# Patient Record
Sex: Female | Born: 1975 | Race: White | Hispanic: No | Marital: Married | Smoking: Former smoker
Health system: Southern US, Community
[De-identification: ages and names within clinical notes are randomized; demographics above are authoritative.]

## PROBLEM LIST (undated history)

## (undated) DIAGNOSIS — R112 Nausea with vomiting, unspecified: Secondary | ICD-10-CM

## (undated) DIAGNOSIS — T4145XA Adverse effect of unspecified anesthetic, initial encounter: Secondary | ICD-10-CM

## (undated) DIAGNOSIS — Z9889 Other specified postprocedural states: Secondary | ICD-10-CM

## (undated) DIAGNOSIS — R1110 Vomiting, unspecified: Secondary | ICD-10-CM

## (undated) DIAGNOSIS — F32A Depression, unspecified: Secondary | ICD-10-CM

## (undated) DIAGNOSIS — T8859XA Other complications of anesthesia, initial encounter: Secondary | ICD-10-CM

## (undated) DIAGNOSIS — F329 Major depressive disorder, single episode, unspecified: Secondary | ICD-10-CM

## (undated) DIAGNOSIS — C801 Malignant (primary) neoplasm, unspecified: Secondary | ICD-10-CM

## (undated) HISTORY — DX: Vomiting, unspecified: R11.10

## (undated) HISTORY — PX: WISDOM TOOTH EXTRACTION: SHX21

## (undated) HISTORY — PX: OTHER SURGICAL HISTORY: SHX169

## (undated) HISTORY — PX: NASAL FRACTURE SURGERY: SHX718

---

## 2002-06-30 ENCOUNTER — Other Ambulatory Visit: Admission: RE | Admit: 2002-06-30 | Discharge: 2002-06-30 | Payer: Self-pay | Admitting: Obstetrics and Gynecology

## 2002-07-30 ENCOUNTER — Ambulatory Visit (HOSPITAL_COMMUNITY): Admission: RE | Admit: 2002-07-30 | Discharge: 2002-07-30 | Payer: Self-pay | Admitting: Obstetrics and Gynecology

## 2003-02-25 ENCOUNTER — Other Ambulatory Visit: Admission: RE | Admit: 2003-02-25 | Discharge: 2003-02-25 | Payer: Self-pay | Admitting: Obstetrics and Gynecology

## 2003-07-07 ENCOUNTER — Ambulatory Visit (HOSPITAL_COMMUNITY): Admission: AD | Admit: 2003-07-07 | Discharge: 2003-07-07 | Payer: Self-pay | Admitting: Obstetrics and Gynecology

## 2003-09-26 ENCOUNTER — Inpatient Hospital Stay (HOSPITAL_COMMUNITY): Admission: AD | Admit: 2003-09-26 | Discharge: 2003-09-29 | Payer: Self-pay | Admitting: Obstetrics and Gynecology

## 2003-09-30 ENCOUNTER — Encounter: Admission: RE | Admit: 2003-09-30 | Discharge: 2003-10-30 | Payer: Self-pay | Admitting: Obstetrics and Gynecology

## 2004-03-28 ENCOUNTER — Other Ambulatory Visit: Admission: RE | Admit: 2004-03-28 | Discharge: 2004-03-28 | Payer: Self-pay | Admitting: Obstetrics and Gynecology

## 2006-04-18 ENCOUNTER — Inpatient Hospital Stay (HOSPITAL_COMMUNITY): Admission: AD | Admit: 2006-04-18 | Discharge: 2006-04-18 | Payer: Self-pay | Admitting: Obstetrics & Gynecology

## 2006-04-22 ENCOUNTER — Inpatient Hospital Stay (HOSPITAL_COMMUNITY): Admission: AD | Admit: 2006-04-22 | Discharge: 2006-04-22 | Payer: Self-pay | Admitting: Obstetrics and Gynecology

## 2006-04-29 ENCOUNTER — Inpatient Hospital Stay (HOSPITAL_COMMUNITY): Admission: AD | Admit: 2006-04-29 | Discharge: 2006-05-02 | Payer: Self-pay | Admitting: Obstetrics & Gynecology

## 2006-05-27 ENCOUNTER — Inpatient Hospital Stay (HOSPITAL_COMMUNITY): Admission: AD | Admit: 2006-05-27 | Discharge: 2006-06-01 | Payer: Self-pay | Admitting: Obstetrics & Gynecology

## 2006-09-19 ENCOUNTER — Ambulatory Visit (HOSPITAL_COMMUNITY): Admission: RE | Admit: 2006-09-19 | Discharge: 2006-09-19 | Payer: Self-pay | Admitting: Obstetrics and Gynecology

## 2006-12-05 ENCOUNTER — Inpatient Hospital Stay (HOSPITAL_COMMUNITY): Admission: RE | Admit: 2006-12-05 | Discharge: 2006-12-08 | Payer: Self-pay | Admitting: Obstetrics and Gynecology

## 2010-10-12 ENCOUNTER — Other Ambulatory Visit: Payer: Self-pay | Admitting: Family Medicine

## 2010-10-12 ENCOUNTER — Inpatient Hospital Stay (INDEPENDENT_AMBULATORY_CARE_PROVIDER_SITE_OTHER)
Admission: RE | Admit: 2010-10-12 | Discharge: 2010-10-12 | Disposition: A | Discharge status: Home / Self Care | Payer: 59 | Source: Ambulatory Visit | Attending: Family Medicine | Admitting: Family Medicine

## 2010-10-12 ENCOUNTER — Ambulatory Visit
Admission: RE | Admit: 2010-10-12 | Discharge: 2010-10-12 | Disposition: A | Discharge status: Home / Self Care | Payer: 59 | Source: Ambulatory Visit | Attending: Family Medicine | Admitting: Family Medicine

## 2010-10-12 ENCOUNTER — Encounter: Payer: Self-pay | Admitting: Family Medicine

## 2010-10-12 ENCOUNTER — Other Ambulatory Visit: Payer: Self-pay | Admitting: Occupational Medicine

## 2010-10-12 DIAGNOSIS — M62838 Other muscle spasm: Secondary | ICD-10-CM

## 2010-10-19 ENCOUNTER — Ambulatory Visit (INDEPENDENT_AMBULATORY_CARE_PROVIDER_SITE_OTHER): Payer: 59 | Admitting: Family Medicine

## 2010-10-19 ENCOUNTER — Encounter: Payer: Self-pay | Admitting: Family Medicine

## 2010-10-19 VITALS — BP 104/68 | HR 50 | Ht 61.0 in | Wt 122.0 lb

## 2010-10-19 DIAGNOSIS — Z23 Encounter for immunization: Secondary | ICD-10-CM

## 2010-10-19 DIAGNOSIS — S139XXA Sprain of joints and ligaments of unspecified parts of neck, initial encounter: Secondary | ICD-10-CM

## 2010-10-19 DIAGNOSIS — S161XXA Strain of muscle, fascia and tendon at neck level, initial encounter: Secondary | ICD-10-CM

## 2010-10-19 NOTE — Progress Notes (Signed)
  Subjective:    Patient ID: Vickie Green, female    DOB: 1975-09-19, 35 y.o.   MRN: [id number]  HPI  No trauma.  Has right sided neck pain and dx with a strain.  Was going on vacation and think may have overdone it.  Pain even at rest.  No spasm in her muscles.  Went to urgent care and was seen.  SHe was put on an NSAID an da pain pill.  Hasn't used the pain med in about 3 days.  Says the diclofenac  Makes her feel weird in the morning. She has been on it for a week.   She is considering havingher IUD removed. She has had it for 3 years but feels more emotional on it. Doesn't want any more kids though.  Review of Systems     Objective:   Physical Exam  Constitutional: She appears well-developed and well-nourished.  HENT:  Head: Normocephalic and atraumatic.  Eyes: Conjunctivae are normal. Pupils are equal, round, and reactive to light.  Neck: Normal range of motion. Neck supple. No thyromegaly present.  Cardiovascular: Normal rate, regular rhythm and normal heart sounds.   Pulmonary/Chest: Effort normal and breath sounds normal.  Musculoskeletal:       Neck with NROM. Sligh dec rotation to the right. nontender cervical spine and paraspinous muscles.            Assessment & Plan:  Next strain-I. think she is improving over the last 2 weeks. We discussed continuing her insulin but the Mobic makes her feel strange and we can change to Aleve or ibuprofen. Certainly continue to take it with food and water. This occurred she would like to see a chiropractor or consider physical therapy. I did give her some exercises to take home and to do and if she is not better in the next 3 weeks then she is to call and we can make a referral for physical therapy and consider x-ray. I do think she is progressing as expected for a musculoskeletal strain.  IUD we discussed the pros and cons of having her IUD removed. It is certainly something she should consider discussing with her OB/GYN as well. I will  leave that in her hands make that decision.

## 2010-10-19 NOTE — Patient Instructions (Addendum)
Can start the exercises and if not getting better in the next 3 weeks then let me know. It

## 2010-11-14 ENCOUNTER — Other Ambulatory Visit: Payer: Self-pay | Admitting: Obstetrics and Gynecology

## 2010-11-14 DIAGNOSIS — N644 Mastodynia: Secondary | ICD-10-CM

## 2010-11-20 ENCOUNTER — Ambulatory Visit
Admission: RE | Admit: 2010-11-20 | Discharge: 2010-11-20 | Disposition: A | Discharge status: Home / Self Care | Payer: 59 | Source: Ambulatory Visit | Attending: Obstetrics and Gynecology | Admitting: Obstetrics and Gynecology

## 2010-11-20 DIAGNOSIS — N644 Mastodynia: Secondary | ICD-10-CM

## 2011-04-04 LAB — CBC
HCT: 31.6 — ABNORMAL LOW
Hemoglobin: 8.9 — ABNORMAL LOW
MCV: 83.9
Platelets: 134 — ABNORMAL LOW
Platelets: 151
RDW: 13.7
WBC: 5.9

## 2011-04-04 LAB — RH IMMUNE GLOB WKUP(>/=20WKS)(NOT WOMEN'S HOSP): Fetal Screen: NEGATIVE

## 2011-05-21 NOTE — Progress Notes (Signed)
Summary: NECK PAIN Room 5   Vital Signs:  Patient Profile:   35 Years Old Female CC:      Neck pain x 1 week Height:     60 inches Weight:      122 pounds O2 Sat:      99 % O2 treatment:    Room Air Temp:     99.3 degrees F oral Pulse rate:   71 / minute Pulse rhythm:   regular Resp:     12 per minute BP sitting:   109 / 71  (left arm) Cuff size:   regular  Vitals Entered By: Emilio Math (October 12, 2010 10:34 AM)                  Prior Medication List:  No prior medications documented  Current Allergies (reviewed today): ! * LACTOSEHistory of Present Illness Chief Complaint: Neck pain x 1 week History of Present Illness: Patient reports having neck pain for about 2 weeks. started on R side but spreaded tothe L side as well. Now she has trouble bending her neck or moving her head. She denies any trauma or injury to her neck. She occasional pops her neck when she has minor discomfort but she never had anything like this before.  Current Problems: MUSCLE SPASM, TRAPEZIUS (ICD-728.85)   Current Meds MIRENA 20 MCG/24HR IUD (LEVONORGESTREL)  MOBIC 7.5 MG TABS (MELOXICAM) 1 by mouth q day to twice a day take w/food do not take w/motrin or alleve ORPHENADRINE CITRATE CR 100 MG XR12H-TAB (ORPHENADRINE CITRATE) 1 by mouth twice a dya just take at night if sedation occurs  REVIEW OF SYSTEMS Constitutional Symptoms      Denies fever, chills, night sweats, weight loss, weight gain, and fatigue.  Eyes       Denies change in vision, eye pain, eye discharge, glasses, contact lenses, and eye surgery. Ear/Nose/Throat/Mouth       Denies hearing loss/aids, change in hearing, ear pain, ear discharge, dizziness, frequent runny nose, frequent nose bleeds, sinus problems, sore throat, hoarseness, and tooth pain or bleeding.  Respiratory       Denies dry cough, productive cough, wheezing, shortness of breath, asthma, bronchitis, and emphysema/COPD.  Cardiovascular       Denies murmurs,  chest pain, and tires easily with exhertion.    Gastrointestinal       Denies stomach pain, nausea/vomiting, diarrhea, constipation, blood in bowel movements, and indigestion. Genitourniary       Denies painful urination, kidney stones, and loss of urinary control. Neurological       Denies paralysis, seizures, and fainting/blackouts. Musculoskeletal       Complains of muscle pain, joint pain, joint stiffness, and decreased range of motion.      Denies redness, swelling, muscle weakness, and gout.  Skin       Denies bruising, unusual mles/lumps or sores, and hair/skin or nail changes.  Psych       Denies mood changes, temper/anger issues, anxiety/stress, speech problems, depression, and sleep problems.  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 Father, Diabetes Mother, Healthy  Social History: Reviewed history and no changes required. 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 Physical Exam General appearance: well developed, well nourished,  mild distress Head: normocephalic,  atraumatic Neck: tendernesss along the bifurcation of both trapezius muscles range of motion  Back: no tenderness over musculature, straight leg raises negative bilaterally, deep tendon reflexes 2+ at achilles and patella Skin: no obvious rashes or lesions MSE: oriented to time, place, and person Assessment New Problems: MUSCLE SPASM, TRAPEZIUS (ICD-728.85)  muscle spasm  cervical strain  Plan New Medications/Changes: ORPHENADRINE CITRATE CR 100 MG XR12H-TAB (ORPHENADRINE CITRATE) 1 by mouth twice a dya just take at night if sedation occurs  #60 x 0, 10/12/2010, Hassan Rowan MD MOBIC 7.5 MG TABS (MELOXICAM) 1  by mouth q day to twice a day take w/food do not take w/motrin or alleve  #30 x 1, 10/12/2010, Hassan Rowan MD  New Orders: Admin of Therapeutic Inj  intramuscular or subcutaneous [16109] New Patient Level III [99203] T-DG Cervical Spine Complete 5 views [72050] Planning Comments:   may need to get chiropractic evaluation  Follow Up: Follow up on an as needed basis, Follow up with Primary Physician  The patient and/or caregiver has been counseled thoroughly with regard to medications prescribed including dosage, schedule, interactions, rationale for use, and possible side effects and they verbalize understanding.  Diagnoses and expected course of recovery discussed and will return if not improved as expected or if the condition worsens. Patient and/or caregiver verbalized understanding.  Prescriptions: ORPHENADRINE CITRATE CR 100 MG XR12H-TAB (ORPHENADRINE CITRATE) 1 by mouth twice a dya just take at night if sedation occurs  #60 x 0   Entered and Authorized by:   Hassan Rowan MD   Signed by:   Hassan Rowan MD on 10/12/2010   Method used:   Print then Give to Patient   RxID:   [id number] MOBIC 7.5 MG TABS (MELOXICAM) 1 by mouth q day to twice a day take w/food do not take w/motrin or alleve  #30 x 1   Entered and Authorized by:   Hassan Rowan MD   Signed by:   Hassan Rowan MD on 10/12/2010   Method used:   Print then Give to Patient   RxID:   [id number]   Patient Instructions: 1)  Please schedule a follow-up appointment in 2 weeks w/PCP or Chiropracter of choice  [facility] [phone] is an option 2)  use mobic and norflex as needed 3)  call us 2 hours after x-ray for results  Medication Administration  Injection # 1:    Medication: Ketorolac-Toradol 15mg     Diagnosis: MUSCLE SPASM, TRAPEZIUS (ICD-728.85)    Route: IM    Site: RUOQ gluteus    Exp Date: 04/19/2011    Lot #: [id number]    Mfr: hospira    Comments: 60mg  given    Patient tolerated injection  without complications    Given by: Lajean Saver RN (October 12, 2010 11:42 AM)  Orders Added: 1)  Admin of Therapeutic Inj  intramuscular or subcutaneous [96372] 2)  New Patient Level III [40102] 3)  T-DG Cervical Spine Complete 5 views [72050]

## 2013-03-17 ENCOUNTER — Other Ambulatory Visit (HOSPITAL_COMMUNITY)
Admission: RE | Admit: 2013-03-17 | Discharge: 2013-03-17 | Disposition: A | Discharge status: Home / Self Care | Payer: PRIVATE HEALTH INSURANCE | Source: Ambulatory Visit | Attending: Family Medicine | Admitting: Family Medicine

## 2013-03-17 ENCOUNTER — Encounter: Payer: Self-pay | Admitting: Family Medicine

## 2013-03-17 ENCOUNTER — Other Ambulatory Visit: Payer: Self-pay | Admitting: Family Medicine

## 2013-03-17 ENCOUNTER — Ambulatory Visit (INDEPENDENT_AMBULATORY_CARE_PROVIDER_SITE_OTHER): Payer: PRIVATE HEALTH INSURANCE | Admitting: Family Medicine

## 2013-03-17 DIAGNOSIS — N760 Acute vaginitis: Secondary | ICD-10-CM | POA: Insufficient documentation

## 2013-03-17 DIAGNOSIS — Z01419 Encounter for gynecological examination (general) (routine) without abnormal findings: Secondary | ICD-10-CM | POA: Insufficient documentation

## 2013-03-17 DIAGNOSIS — Z1151 Encounter for screening for human papillomavirus (HPV): Secondary | ICD-10-CM | POA: Insufficient documentation

## 2013-03-17 DIAGNOSIS — Z23 Encounter for immunization: Secondary | ICD-10-CM

## 2013-03-17 DIAGNOSIS — Z113 Encounter for screening for infections with a predominantly sexual mode of transmission: Secondary | ICD-10-CM | POA: Insufficient documentation

## 2013-03-17 DIAGNOSIS — R109 Unspecified abdominal pain: Secondary | ICD-10-CM

## 2013-03-17 DIAGNOSIS — K644 Residual hemorrhoidal skin tags: Secondary | ICD-10-CM

## 2013-03-17 LAB — CBC WITH DIFFERENTIAL/PLATELET
Basophils Absolute: 0 10*3/uL (ref 0.0–0.1)
Basophils Relative: 0 % (ref 0–1)
Eosinophils Absolute: 0.1 10*3/uL (ref 0.0–0.7)
MCHC: 34.7 g/dL (ref 30.0–36.0)
Neutro Abs: 1.9 10*3/uL (ref 1.7–7.7)
Neutrophils Relative %: 55 % (ref 43–77)
Platelets: 236 10*3/uL (ref 150–400)
RDW: 13.7 % (ref 11.5–15.5)

## 2013-03-17 LAB — COMPREHENSIVE METABOLIC PANEL
AST: 20 U/L (ref 0–37)
Albumin: 4.6 g/dL (ref 3.5–5.2)
Alkaline Phosphatase: 52 U/L (ref 39–117)
Potassium: 4.1 mEq/L (ref 3.5–5.3)
Sodium: 138 mEq/L (ref 135–145)
Total Bilirubin: 0.5 mg/dL (ref 0.3–1.2)
Total Protein: 7.4 g/dL (ref 6.0–8.3)

## 2013-03-17 MED ORDER — LIDOCAINE-HYDROCORTISONE ACE 3-0.5 % RE CREA: 1.0000 | TOPICAL_CREAM | Freq: Two times a day (BID) | RECTAL | Status: DC

## 2013-03-17 NOTE — Progress Notes (Signed)
Subjective:    Patient ID: Vickie Green, female    DOB: 07-12-1975, 36 y.o.   MRN: [id number]  HPI  Last seen 2 yr ago for neck pain.   Has has a mass on rectum since Feb. Hs been getting larger and feels like hard to clean the area. Has been very painful and has noticed some bleeding. Hx of hemorrhoids about 6 years ago during her last pregnancy.   Has a occ abdominal pain in the lower abdomen. Thought it may be related to lactose intolerance. Says feels like a hot knife poking into her abdomen. Starts below the belly button. Usually lasts about an hour. Last time lasted a day. +nausea but no vomiting. Usually happens after a BM. Can happen after urination. Has used Gas-x and Tylenol and helped some. No fever. Did have some chills with the last episode.   Review of Systems  BP 123/76  Pulse 87  Wt 117 lb (53.071 kg)  BMI 22.12 kg/m2    Allergies  Allergen Reactions  . Lactose     Past Medical History  Diagnosis Date  . Hyperemesis     during pregnanacy  . Endometriosis     Past Surgical History  Procedure Laterality Date  . Nasal fracture surgery    . Laproscopy    . Cesarean section      4098,1191    History   Social History  . Marital Status: Married    Spouse Name: N/A    Number of Children: N/A  . Years of Education: N/A   Occupational History  . Not on file.   Social History Main Topics  . Smoking status: Former Smoker    Quit date: 11/16/2001  . Smokeless tobacco: Not on file  . Alcohol Use: Yes  . Drug Use: No  . Sexual Activity: Yes    Birth Control/ Protection: IUD   Other Topics Concern  . Not on file   Social History Narrative  . No narrative on file    Family History  Problem Relation Age of Onset  . Alcohol abuse    . Colon cancer    . Brain cancer    . Pancreatic cancer    . Depression    . Stroke      grandmother  . Emphysema    . Diverticulitis      Outpatient Encounter Prescriptions as of 03/17/2013  Medication Sig  Dispense Refill  . lidocaine-hydrocortisone (ANAMANTEL HC) 3-0.5 % CREA Place 1 Applicatorful rectally 2 (two) times daily.  28.35 g  2  . [DISCONTINUED] Levonorgestrel (MIRENA IU) by Intrauterine route.        . [DISCONTINUED] meloxicam (MOBIC) 7.5 MG tablet Take 7.5 mg by mouth daily.        . [DISCONTINUED] orphenadrine (NORFLEX) 100 MG tablet Take 100 mg by mouth 2 (two) times daily as needed.         No facility-administered encounter medications on file as of 03/17/2013.          Objective:   Physical Exam  Constitutional: She appears well-developed and well-nourished.  HENT:  Head: Normocephalic and atraumatic.  Abdominal: Soft. Bowel sounds are normal. She exhibits no distension and no mass. There is tenderness. There is no rebound and no guarding. Hernia confirmed negative in the right inguinal area and confirmed negative in the left inguinal area.  Diffusely tender.  Genitourinary: Vagina normal and uterus normal. Rectal exam shows external hemorrhoid. There is no rash on the right  labia. There is no rash on the left labia. Cervix exhibits no motion tenderness, no discharge and no friability. Right adnexum displays no mass, no tenderness and no fullness. Left adnexum displays no mass, no tenderness and no fullness.  Lymphadenopathy:       Right: No inguinal adenopathy present.       Left: No inguinal adenopathy present.  Skin: Skin is warm and dry.  Psychiatric: She has a normal mood and affect. Her behavior is normal.          Assessment & Plan:  Lower abd pain - Unclear etiology. Not consistant with diverticulis as is brief and then can resolve for weeks.  We'll also test for milk allergy as well as gluten intolerance. We'll also do a CBC with differential and check a CMP to make sure liver enzymes are normal. We did go ahead and perform her here today since we were doing a pelvic exam. We did add a wet prep and GC and chlamydia to that. If all labs normal then recommend  referal to GI.   Hemorrhoids-  Exam confirmed external hemorrhoid. It is not thrombosed. It is mildly swollen and irritated. We'll treat with topical steroid cream and possibly topical lidocaine if needed. Recommend sitz baths and trying to regulate stools are softer consistency but not runny.  Screen cervical cancer-Pap smear performed her workup results approximately one week.

## 2013-03-18 LAB — GLIA (IGA/G) + TTG IGA
Gliadin IgA: 3.6 U/mL (ref <20)
Gliadin IgG: 30.3 U/mL — ABNORMAL HIGH (ref <20)
Tissue Transglutaminase Ab, IgA: 3 U/mL (ref <20)

## 2013-03-19 ENCOUNTER — Other Ambulatory Visit: Payer: Self-pay | Admitting: Family Medicine

## 2013-03-19 MED ORDER — METRONIDAZOLE 500 MG PO TABS: 500.0000 mg | ORAL_TABLET | Freq: Two times a day (BID) | ORAL | Status: DC

## 2013-03-19 MED ORDER — FLUCONAZOLE 150 MG PO TABS: 150.0000 mg | ORAL_TABLET | Freq: Once | ORAL | Status: DC

## 2015-10-17 ENCOUNTER — Other Ambulatory Visit: Payer: Self-pay | Admitting: Obstetrics and Gynecology

## 2015-10-18 LAB — CYTOLOGY - PAP

## 2016-12-25 DIAGNOSIS — C801 Malignant (primary) neoplasm, unspecified: Secondary | ICD-10-CM

## 2016-12-25 HISTORY — DX: Malignant (primary) neoplasm, unspecified: C80.1

## 2016-12-25 HISTORY — PX: COLONOSCOPY: SHX174

## 2017-01-25 ENCOUNTER — Other Ambulatory Visit: Payer: Self-pay | Admitting: Obstetrics and Gynecology

## 2017-01-25 DIAGNOSIS — R928 Other abnormal and inconclusive findings on diagnostic imaging of breast: Secondary | ICD-10-CM

## 2017-02-01 ENCOUNTER — Ambulatory Visit
Admission: RE | Admit: 2017-02-01 | Discharge: 2017-02-01 | Disposition: A | Discharge status: Home / Self Care | Payer: 59 | Source: Ambulatory Visit | Attending: Obstetrics and Gynecology | Admitting: Obstetrics and Gynecology

## 2017-02-01 DIAGNOSIS — R928 Other abnormal and inconclusive findings on diagnostic imaging of breast: Secondary | ICD-10-CM

## 2017-03-26 ENCOUNTER — Encounter (HOSPITAL_COMMUNITY): Payer: Self-pay | Admitting: *Deleted

## 2017-03-27 ENCOUNTER — Other Ambulatory Visit: Payer: Self-pay | Admitting: Obstetrics and Gynecology

## 2017-04-12 ENCOUNTER — Encounter (HOSPITAL_COMMUNITY): Payer: Self-pay

## 2017-04-15 ENCOUNTER — Encounter (HOSPITAL_COMMUNITY)
Admission: RE | Disposition: A | Discharge status: Home / Self Care | Payer: Self-pay | Source: Ambulatory Visit | Attending: Obstetrics and Gynecology

## 2017-04-15 ENCOUNTER — Ambulatory Visit (HOSPITAL_COMMUNITY): Payer: 59 | Admitting: Certified Registered Nurse Anesthetist

## 2017-04-15 ENCOUNTER — Ambulatory Visit (HOSPITAL_COMMUNITY)
Admission: RE | Admit: 2017-04-15 | Discharge: 2017-04-15 | Disposition: A | Discharge status: Home / Self Care | Payer: 59 | Source: Ambulatory Visit | Attending: Obstetrics and Gynecology | Admitting: Obstetrics and Gynecology

## 2017-04-15 ENCOUNTER — Encounter (HOSPITAL_COMMUNITY): Payer: Self-pay

## 2017-04-15 DIAGNOSIS — Z87891 Personal history of nicotine dependence: Secondary | ICD-10-CM | POA: Diagnosis not present

## 2017-04-15 DIAGNOSIS — Z79899 Other long term (current) drug therapy: Secondary | ICD-10-CM | POA: Diagnosis not present

## 2017-04-15 DIAGNOSIS — F329 Major depressive disorder, single episode, unspecified: Secondary | ICD-10-CM | POA: Diagnosis not present

## 2017-04-15 DIAGNOSIS — R1032 Left lower quadrant pain: Secondary | ICD-10-CM | POA: Diagnosis present

## 2017-04-15 DIAGNOSIS — G8929 Other chronic pain: Secondary | ICD-10-CM | POA: Insufficient documentation

## 2017-04-15 HISTORY — DX: Major depressive disorder, single episode, unspecified: F32.9

## 2017-04-15 HISTORY — DX: Other complications of anesthesia, initial encounter: T88.59XA

## 2017-04-15 HISTORY — DX: Adverse effect of unspecified anesthetic, initial encounter: T41.45XA

## 2017-04-15 HISTORY — DX: Malignant (primary) neoplasm, unspecified: C80.1

## 2017-04-15 HISTORY — DX: Nausea with vomiting, unspecified: R11.2

## 2017-04-15 HISTORY — DX: Depression, unspecified: F32.A

## 2017-04-15 HISTORY — PX: LAPAROSCOPY: SHX197

## 2017-04-15 HISTORY — DX: Other specified postprocedural states: Z98.890

## 2017-04-15 LAB — CBC
HEMATOCRIT: 38.6 % (ref 36.0–46.0)
Hemoglobin: 13.2 g/dL (ref 12.0–15.0)
MCH: 29.4 pg (ref 26.0–34.0)
MCHC: 34.2 g/dL (ref 30.0–36.0)
MCV: 86 fL (ref 78.0–100.0)
Platelets: 254 10*3/uL (ref 150–400)
RBC: 4.49 MIL/uL (ref 3.87–5.11)
RDW: 13.5 % (ref 11.5–15.5)
WBC: 4.6 10*3/uL (ref 4.0–10.5)

## 2017-04-15 LAB — PREGNANCY, URINE: Preg Test, Ur: NEGATIVE

## 2017-04-15 SURGERY — LAPAROSCOPY, DIAGNOSTIC
Anesthesia: General | Site: Abdomen

## 2017-04-15 MED ORDER — KETOROLAC TROMETHAMINE 30 MG/ML IJ SOLN
INTRAMUSCULAR | Status: DC | PRN
  Administered 2017-04-15: 30 mg via INTRAVENOUS

## 2017-04-15 MED ORDER — GLYCOPYRROLATE 0.2 MG/ML IJ SOLN
INTRAMUSCULAR | Status: DC | PRN
  Administered 2017-04-15: 0.2 mg via INTRAVENOUS

## 2017-04-15 MED ORDER — NEOSTIGMINE METHYLSULFATE 10 MG/10ML IV SOLN
INTRAVENOUS | Status: AC
  Filled 2017-04-15: qty 1

## 2017-04-15 MED ORDER — ONDANSETRON HCL 4 MG/2ML IJ SOLN
INTRAMUSCULAR | Status: DC
Start: 2017-04-15 — End: 2017-04-15
  Filled 2017-04-15: qty 2

## 2017-04-15 MED ORDER — MIDAZOLAM HCL 2 MG/2ML IJ SOLN: 0.5000 mg | Freq: Once | INTRAMUSCULAR | Status: DC | PRN

## 2017-04-15 MED ORDER — HYDROMORPHONE HCL 1 MG/ML IJ SOLN: 0.2500 mg | INTRAMUSCULAR | Status: DC | PRN

## 2017-04-15 MED ORDER — BUPIVACAINE HCL (PF) 0.25 % IJ SOLN
INTRAMUSCULAR | Status: AC
  Filled 2017-04-15: qty 30

## 2017-04-15 MED ORDER — OXYCODONE-ACETAMINOPHEN 2.5-325 MG PO TABS: 1.0000 | ORAL_TABLET | ORAL | 0 refills | Status: AC | PRN

## 2017-04-15 MED ORDER — KETOROLAC TROMETHAMINE 30 MG/ML IJ SOLN
INTRAMUSCULAR | Status: AC
  Filled 2017-04-15: qty 1

## 2017-04-15 MED ORDER — GLYCOPYRROLATE 0.2 MG/ML IJ SOLN
INTRAMUSCULAR | Status: AC
  Filled 2017-04-15: qty 3

## 2017-04-15 MED ORDER — ROCURONIUM BROMIDE 100 MG/10ML IV SOLN
INTRAVENOUS | Status: AC
  Filled 2017-04-15: qty 1

## 2017-04-15 MED ORDER — BUPIVACAINE HCL (PF) 0.25 % IJ SOLN
INTRAMUSCULAR | Status: DC | PRN
  Administered 2017-04-15: 7 mL

## 2017-04-15 MED ORDER — ROCURONIUM BROMIDE 100 MG/10ML IV SOLN
INTRAVENOUS | Status: DC | PRN
  Administered 2017-04-15: 40 mg via INTRAVENOUS

## 2017-04-15 MED ORDER — LIDOCAINE HCL (CARDIAC) 20 MG/ML IV SOLN
INTRAVENOUS | Status: DC | PRN
  Administered 2017-04-15: 60 mg via INTRAVENOUS

## 2017-04-15 MED ORDER — ONDANSETRON HCL 4 MG/2ML IJ SOLN
INTRAMUSCULAR | Status: AC
  Filled 2017-04-15: qty 2

## 2017-04-15 MED ORDER — SCOPOLAMINE 1 MG/3DAYS TD PT72
MEDICATED_PATCH | TRANSDERMAL | Status: AC
  Administered 2017-04-15: 1.5 mg via TRANSDERMAL
  Filled 2017-04-15: qty 1

## 2017-04-15 MED ORDER — PROMETHAZINE HCL 25 MG/ML IJ SOLN: 6.2500 mg | INTRAMUSCULAR | Status: DC | PRN

## 2017-04-15 MED ORDER — DEXAMETHASONE SODIUM PHOSPHATE 10 MG/ML IJ SOLN
INTRAMUSCULAR | Status: DC | PRN
  Administered 2017-04-15: 5 mg via INTRAVENOUS

## 2017-04-15 MED ORDER — MIDAZOLAM HCL 2 MG/2ML IJ SOLN
INTRAMUSCULAR | Status: AC
  Filled 2017-04-15: qty 2

## 2017-04-15 MED ORDER — SCOPOLAMINE 1 MG/3DAYS TD PT72
1.0000 | MEDICATED_PATCH | Freq: Once | TRANSDERMAL | Status: DC
  Administered 2017-04-15: 1.5 mg via TRANSDERMAL

## 2017-04-15 MED ORDER — SUGAMMADEX SODIUM 200 MG/2ML IV SOLN
INTRAVENOUS | Status: DC | PRN
  Administered 2017-04-15: 200 mg via INTRAVENOUS

## 2017-04-15 MED ORDER — MIDAZOLAM HCL 2 MG/2ML IJ SOLN
INTRAMUSCULAR | Status: DC | PRN
  Administered 2017-04-15: 2 mg via INTRAVENOUS

## 2017-04-15 MED ORDER — DEXAMETHASONE SODIUM PHOSPHATE 10 MG/ML IJ SOLN
INTRAMUSCULAR | Status: AC
  Filled 2017-04-15: qty 1

## 2017-04-15 MED ORDER — ONDANSETRON HCL 4 MG/2ML IJ SOLN
INTRAMUSCULAR | Status: DC | PRN
  Administered 2017-04-15: 4 mg via INTRAVENOUS

## 2017-04-15 MED ORDER — PROPOFOL 10 MG/ML IV BOLUS
INTRAVENOUS | Status: AC
  Filled 2017-04-15: qty 20

## 2017-04-15 MED ORDER — MEPERIDINE HCL 25 MG/ML IJ SOLN: 6.2500 mg | INTRAMUSCULAR | Status: DC | PRN

## 2017-04-15 MED ORDER — FENTANYL CITRATE (PF) 250 MCG/5ML IJ SOLN
INTRAMUSCULAR | Status: AC
  Filled 2017-04-15: qty 5

## 2017-04-15 MED ORDER — LIDOCAINE HCL (CARDIAC) 20 MG/ML IV SOLN
INTRAVENOUS | Status: AC
  Filled 2017-04-15: qty 5

## 2017-04-15 MED ORDER — PROPOFOL 10 MG/ML IV BOLUS
INTRAVENOUS | Status: DC | PRN
  Administered 2017-04-15: 150 mg via INTRAVENOUS

## 2017-04-15 MED ORDER — LACTATED RINGERS IV SOLN
INTRAVENOUS | Status: DC
  Administered 2017-04-15 (×2): via INTRAVENOUS

## 2017-04-15 MED ORDER — FENTANYL CITRATE (PF) 100 MCG/2ML IJ SOLN
INTRAMUSCULAR | Status: DC | PRN
  Administered 2017-04-15: 50 ug via INTRAVENOUS
  Administered 2017-04-15: 100 ug via INTRAVENOUS
  Administered 2017-04-15 (×2): 50 ug via INTRAVENOUS

## 2017-04-15 MED ORDER — CEFAZOLIN SODIUM-DEXTROSE 1-4 GM/50ML-% IV SOLN
INTRAVENOUS | Status: DC | PRN
  Administered 2017-04-15: 2 g via INTRAVENOUS

## 2017-04-15 MED ORDER — CEFAZOLIN SODIUM-DEXTROSE 2-3 GM-%(50ML) IV SOLR
INTRAVENOUS | Status: AC
  Filled 2017-04-15: qty 50

## 2017-04-15 SURGICAL SUPPLY — 27 items
ADH SKN CLS APL DERMABOND .7 (GAUZE/BANDAGES/DRESSINGS) ×2
CABLE HIGH FREQUENCY MONO STRZ (ELECTRODE) IMPLANT
CATH ROBINSON RED A/P 16FR (CATHETERS) ×4 IMPLANT
DERMABOND ADVANCED (GAUZE/BANDAGES/DRESSINGS) ×2
DERMABOND ADVANCED .7 DNX12 (GAUZE/BANDAGES/DRESSINGS) ×2 IMPLANT
DRSG OPSITE POSTOP 3X4 (GAUZE/BANDAGES/DRESSINGS) ×4 IMPLANT
DURAPREP 26ML APPLICATOR (WOUND CARE) ×4 IMPLANT
FILTER SMOKE EVAC LAPAROSHD (FILTER) IMPLANT
FORCEPS CUTTING 33CM 5MM (CUTTING FORCEPS) IMPLANT
GLOVE BIOGEL PI IND STRL 7.0 (GLOVE) ×6 IMPLANT
GLOVE BIOGEL PI INDICATOR 7.0 (GLOVE) ×6
GLOVE ECLIPSE 7.0 STRL STRAW (GLOVE) ×4 IMPLANT
GOWN STRL REUS W/TWL LRG LVL3 (GOWN DISPOSABLE) ×8 IMPLANT
NS IRRIG 1000ML POUR BTL (IV SOLUTION) ×4 IMPLANT
PACK LAPAROSCOPY BASIN (CUSTOM PROCEDURE TRAY) ×4 IMPLANT
PACK TRENDGUARD 450 HYBRID PRO (MISCELLANEOUS) ×2 IMPLANT
PROTECTOR NERVE ULNAR (MISCELLANEOUS) ×8 IMPLANT
SCISSORS LAP 5X35 DISP (ENDOMECHANICALS) IMPLANT
SET IRRIG TUBING LAPAROSCOPIC (IRRIGATION / IRRIGATOR) IMPLANT
SLEEVE XCEL OPT CAN 5 100 (ENDOMECHANICALS) ×3 IMPLANT
SUT VICRYL 0 UR6 27IN ABS (SUTURE) ×4 IMPLANT
SUT VICRYL RAPIDE 3 0 (SUTURE) IMPLANT
TOWEL OR 17X24 6PK STRL BLUE (TOWEL DISPOSABLE) ×8 IMPLANT
TRENDGUARD 450 HYBRID PRO PACK (MISCELLANEOUS) ×4
TROCAR BALLN 12MMX100 BLUNT (TROCAR) ×4 IMPLANT
TROCAR XCEL NON-BLD 5MMX100MML (ENDOMECHANICALS) ×4 IMPLANT
WARMER LAPAROSCOPE (MISCELLANEOUS) ×4 IMPLANT

## 2017-04-15 NOTE — Anesthesia Procedure Notes (Signed)
Procedure Name: Intubation Date/Time: 04/15/2017 12:30 PM Performed by: Annye Asa Pre-anesthesia Checklist: Patient identified, Patient being monitored, Timeout performed, Emergency Drugs available and Suction available Patient Re-evaluated:Patient Re-evaluated prior to induction Oxygen Delivery Method: Circle System Utilized Preoxygenation: Pre-oxygenation with 100% oxygen Induction Type: IV induction Ventilation: Mask ventilation without difficulty Laryngoscope Size: Miller and 2 Grade View: Grade I Tube type: Oral Tube size: 7.0 mm Number of attempts: 1 Airway Equipment and Method: stylet Placement Confirmation: ETT inserted through vocal cords under direct vision,  positive ETCO2 and breath sounds checked- equal and bilateral Secured at: 22 cm Tube secured with: Tape Dental Injury: Teeth and Oropharynx as per pre-operative assessment

## 2017-04-15 NOTE — Anesthesia Preprocedure Evaluation (Signed)
Anesthesia Evaluation  Patient identified by MRN, date of birth, ID band Patient awake    Reviewed: Allergy & Precautions, NPO status , Patient's Chart, lab work & pertinent test results  History of Anesthesia Complications (+) PONV  Airway Mallampati: I  TM Distance: >3 FB Neck ROM: Full    Dental  (+) Dental Advisory Given   Pulmonary former smoker,    breath sounds clear to auscultation       Cardiovascular negative cardio ROS   Rhythm:Regular Rate:Normal     Neuro/Psych negative neurological ROS     GI/Hepatic negative GI ROS, Neg liver ROS,   Endo/Other  negative endocrine ROS  Renal/GU negative Renal ROS     Musculoskeletal   Abdominal   Peds  Hematology negative hematology ROS (+)   Anesthesia Other Findings   Reproductive/Obstetrics                             Anesthesia Physical Anesthesia Plan  ASA: I  Anesthesia Plan: General   Post-op Pain Management:    Induction: Intravenous  PONV Risk Score and Plan: 4 or greater and Ondansetron, Dexamethasone, Midazolam and Scopolamine patch - Pre-op  Airway Management Planned: Oral ETT  Additional Equipment:   Intra-op Plan:   Post-operative Plan: Extubation in OR  Informed Consent: I have reviewed the patients History and Physical, chart, labs and discussed the procedure including the risks, benefits and alternatives for the proposed anesthesia with the patient or authorized representative who has indicated his/her understanding and acceptance.   Dental advisory given  Plan Discussed with: CRNA and Surgeon  Anesthesia Plan Comments: (Plan routine monitors, GETA)        Anesthesia Quick Evaluation

## 2017-04-15 NOTE — H&P (Signed)
Vickie Green is an 41 y.o. white female who presents to the OR for a Dx Scope secondary to persistent LLQ abd pain. Pt has had negative Ct scan and u/s.She has a neg GI workup. She does have a h.o. C/s and endometriosis. Chief Complaint: HPI:  Past Medical History:  Diagnosis Date  . 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 Complication of anesthesia   . Depression    post partium   . Endometriosis   . Hyperemesis    during pregnanacy  . PONV (postoperative nausea and vomiting)     Past Surgical History:  Procedure Laterality Date  . CESAREAN SECTION  2005, 2008  . COLONOSCOPY  12/25/2016   precancerous 75mm polyp removed  . laproscopy     endometriosis and check fallopan tubes  . NASAL FRACTURE SURGERY    . WISDOM TOOTH EXTRACTION      Family History  Problem Relation Age of Onset  . Alcohol abuse Unknown   . Colon cancer Unknown   . Brain cancer Unknown   . Pancreatic cancer Unknown   . Depression Unknown   . Stroke Unknown        grandmother  . Emphysema Unknown   . Diverticulitis Mother   . Diverticulitis Father   . Breast cancer Maternal Aunt    Social History:  reports that she quit smoking about 15 years ago. Her smoking use included Cigarettes. She has a 6.00 pack-year smoking history. She has never used smokeless tobacco. She reports that she does not drink alcohol or use drugs.  Allergies:  Allergies  Allergen Reactions  . Gluten Meal Anaphylaxis    Gluten free meal  . Lactose   . Other Other (See Comments)    PT CAN NOT TOLERATE STRONG PAIN MEDICATION    Medications Prior to Admission  Medication Sig Dispense Refill  . dicyclomine (BENTYL) 10 MG capsule Take 10 mg by mouth daily as needed for spasms.    . polyethylene glycol (MIRALAX / GLYCOLAX) packet Take 17 g by mouth daily as needed for moderate constipation.         Blood pressure 100/70, pulse 70, temperature 98.1 F (36.7 C), temperature source Oral, resp.  rate 16, height 5' (1.524 m), weight 117 lb (53.1 kg), last menstrual period 03/23/2017, SpO2 100 %. BP 100/70   Pulse 70   Temp 98.1 F (36.7 C) (Oral)   Resp 16   Ht 5' (1.524 m)   Wt 117 lb (53.1 kg)   LMP 03/23/2017 (Exact Date)   SpO2 100%   BMI 22.85 kg/m  Lungs: clear to auscultation bilaterally Abdomen: soft, non-tender; bowel sounds normal; no masses,  no organomegaly   Lab Results  Component Value Date   WBC 4.6 04/15/2017   HGB 13.2 04/15/2017   HCT 38.6 04/15/2017   MCV 86.0 04/15/2017   PLT 254 04/15/2017   Lab Results  Component Value Date   PREGTESTUR NEGATIVE 04/15/2017       Patient Active Problem List   Diagnosis Date Noted  . External hemorrhoid 03/17/2013  . MUSCLE SPASM, TRAPEZIUS 10/12/2010   IMP/ Chronic RLQ pain Pain/ Dx scope  Caralina Nop E 04/15/2017, 12:06 PM

## 2017-04-15 NOTE — Anesthesia Postprocedure Evaluation (Signed)
Anesthesia Post Note  Patient: Lelon Perla  Procedure(s) Performed: LAPAROSCOPY DIAGNOSTIC (N/A Abdomen)     Patient location during evaluation: PACU Anesthesia Type: General Level of consciousness: awake and alert, patient cooperative and oriented Pain management: pain level controlled Vital Signs Assessment: post-procedure vital signs reviewed and stable Respiratory status: spontaneous breathing, nonlabored ventilation and respiratory function stable Cardiovascular status: blood pressure returned to baseline and stable Postop Assessment: no apparent nausea or vomiting Anesthetic complications: no    Last Vitals:  Vitals:   04/15/17 1345 04/15/17 1400  BP: (!) 92/59   Pulse: 64   Resp: 11   Temp:  (P) 36.6 C  SpO2: 94%     Last Pain:  Vitals:   04/15/17 1054  TempSrc: Oral   Pain Goal: Patients Stated Pain Goal: 4 (04/15/17 1054)               Lunna Vogelgesang,E. Emmani Lesueur

## 2017-04-15 NOTE — Discharge Instructions (Signed)
Post Anesthesia Home Care Instructions    NO IBUPROFEN BEFORE 7 PM TONIGHT   Activity: Get plenty of rest for the remainder of the day. A responsible individual must stay with you for 24 hours following the procedure.  For the next 24 hours, DO NOT: -Drive a car -Paediatric nurse -Drink alcoholic beverages -Take any medication unless instructed by your physician -Make any legal decisions or sign important papers.  Meals: Start with liquid foods such as gelatin or soup. Progress to regular foods as tolerated. Avoid greasy, spicy, heavy foods. If nausea and/or vomiting occur, drink only clear liquids until the nausea and/or vomiting subsides. Call your physician if vomiting continues.  Special Instructions/Symptoms: Your throat may feel dry or sore from the anesthesia or the breathing tube placed in your throat during surgery. If this causes discomfort, gargle with warm salt water. The discomfort should disappear within 24 hours.  If you had a scopolamine patch placed behind your ear for the management of post- operative nausea and/or vomiting:  1. The medication in the patch is effective for 72 hours, after which it should be removed.  Wrap patch in a tissue and discard in the trash. Wash hands thoroughly with soap and water. 2. You may remove the patch earlier than 72 hours if you experience unpleasant side effects which may include dry mouth, dizziness or visual disturbances. 3. Avoid touching the patch. Wash your hands with soap and water after contact with the patch.   DISCHARGE INSTRUCTIONS: Laparoscopy  The following instructions have been prepared to help you care for yourself upon your return home today.  Wound care:  Do not get the incision wet for the first 24 hours. The incision should be kept clean and dry.  The Band-Aids or dressings may be removed the day after surgery.  Should the incision become sore, red, and swollen after the first week, check with your  doctor.  Personal hygiene:  Shower the day after your procedure.  Activity and limitations:  Do NOT drive or operate any equipment today.  Do NOT lift anything more than 15 pounds for 2-3 weeks after surgery.  Do NOT rest in bed all day.  Walking is encouraged. Walk each day, starting slowly with 5-minute walks 3 or 4 times a day. Slowly increase the length of your walks.  Walk up and down stairs slowly.  Do NOT do strenuous activities, such as golfing, playing tennis, bowling, running, biking, weight lifting, gardening, mowing, or vacuuming for 2-4 weeks. Ask your doctor when it is okay to start.  Diet: Eat a light meal as desired this evening. You may resume your usual diet tomorrow.  Return to work: This is dependent on the type of work you do. For the most part you can return to a desk job within a week of surgery. If you are more active at work, please discuss this with your doctor.  What to expect after your surgery: You may have a slight burning sensation when you urinate on the first day. You may have a very small amount of blood in the urine. Expect to have a small amount of vaginal discharge/light bleeding for 1-2 weeks. It is not unusual to have abdominal soreness and bruising for up to 2 weeks. You may be tired and need more rest for about 1 week. You may experience shoulder pain for 24-72 hours. Lying flat in bed may relieve it.  Call your doctor for any of the following:  Develop a fever of 100.4  or greater  Inability to urinate 6 hours after discharge from hospital  Severe pain not relieved by pain medications  Persistent of heavy bleeding at incision site  Redness or swelling around incision site after a week  Increasing nausea or vomiting  Patient Signature________________________________________ Nurse Signature_________________________________________

## 2017-04-15 NOTE — Transfer of Care (Signed)
Immediate Anesthesia Transfer of Care Note  Patient: Vickie Green  Procedure(s) Performed: LAPAROSCOPY OPERATIVE (N/A Abdomen)  Patient Location: PACU  Anesthesia Type:General  Level of Consciousness: awake, alert  and oriented  Airway & Oxygen Therapy: Patient Spontanous Breathing and Patient connected to nasal cannula oxygen  Post-op Assessment: Report given to RN, Post -op Vital signs reviewed and stable and Patient moving all extremities X 4  Post vital signs: Reviewed and stable  Last Vitals:  Vitals:   04/15/17 1054  BP: 100/70  Pulse: 70  Resp: 16  Temp: 36.7 C  SpO2: 100%    Last Pain:  Vitals:   04/15/17 1054  TempSrc: Oral      Patients Stated Pain Goal: 4 (09/81/19 1478)  Complications: No apparent anesthesia complications

## 2017-04-16 ENCOUNTER — Encounter (HOSPITAL_COMMUNITY): Payer: Self-pay | Admitting: Obstetrics and Gynecology

## 2017-04-16 NOTE — Op Note (Signed)
NAMESHALEENA, CRUSOE NO.:  [id number]  MEDICAL RECORD NO.:  [id number]  LOCATION:                                 FACILITY:  PHYSICIAN:  Freda Munro, M.D.         DATE OF BIRTH:  DATE OF PROCEDURE:  04/15/2017 DATE OF DISCHARGE:                              OPERATIVE REPORT   PREOPERATIVE DIAGNOSIS:  Chronic left lower quadrant pain.  POSTOPERATIVE DIAGNOSIS:  Chronic left lower quadrant pain.  PROCEDURE:  Diagnostic laparoscopy.  SURGEON:  Freda Munro, MD.  ANESTHESIA:  Local and general.  ANTIBIOTICS:  Ancef 2 g.  DRAINS:  Red rubber catheter to bladder.  SPECIMENS:  None.  FINDINGS:  The patient had a normal right upper quadrant.  The liver appeared normal.  There were no adhesions.  The ascending colon appeared to be normal.  The appendix was long, however, it was not inflamed. There were no adhesions around the appendix.  The right upper quadrant, the spleen was not visualized and there was no evidence of any adhesions.  The entire length of the colon, descending colon, and sigmoid was within normal limits.  There were no adhesions or erythema. The anterior cul-de-sac was normal.  There was evidence of past cesarean section.  The uterus and fallopian tubes were normal bilaterally.  The ovaries were normal bilaterally.  Pelvic sidewalls were without adhesions or evidence of endometriosis.  The ureters were both seen and functioning.  The posterior cul-de-sac had one scar from past cautery of endometriosis.  There was no active endometriosis seen.  Uterosacral ligaments appeared to be normal.  There was no evidence of any adhesions throughout the small bowel.  At this point, the procedure was concluded.  The instruments were removed and pneumoperitoneum released.  The fascia was closed with 0 Vicryl suture. The skin was closed using Dermabond.  The patient was extubated and taken to the recovery room.  Instrument and lap counts correct  x2.    ______________________________ Freda Munro, M.D.   ______________________________ Freda Munro, M.D.    MA/MEDQ  D:  04/15/2017  T:  04/16/2017  Job:  [id number]

## 2020-09-08 ENCOUNTER — Other Ambulatory Visit: Payer: Self-pay | Admitting: Obstetrics and Gynecology

## 2020-09-08 DIAGNOSIS — N6320 Unspecified lump in the left breast, unspecified quadrant: Secondary | ICD-10-CM

## 2020-10-17 ENCOUNTER — Ambulatory Visit
Admission: RE | Admit: 2020-10-17 | Discharge: 2020-10-17 | Disposition: A | Discharge status: Home / Self Care | Payer: 59 | Source: Ambulatory Visit | Attending: Obstetrics and Gynecology | Admitting: Obstetrics and Gynecology

## 2020-10-17 ENCOUNTER — Other Ambulatory Visit: Payer: Self-pay

## 2020-10-17 DIAGNOSIS — N6320 Unspecified lump in the left breast, unspecified quadrant: Secondary | ICD-10-CM

## 2020-10-21 ENCOUNTER — Other Ambulatory Visit: Payer: 59

## 2021-10-26 IMAGING — US US BREAST*L* LIMITED INC AXILLA
1 series · 13 of 18 positions shown · non-contrast
Comparison: Previous exam(s).

CLINICAL DATA: 44-year-old female with palpable left breast lumps.

EXAM:
DIGITAL DIAGNOSTIC BILATERAL MAMMOGRAM WITH TOMOSYNTHESIS AND CAD;
ULTRASOUND LEFT BREAST LIMITED
TECHNIQUE: Bilateral digital diagnostic mammography and breast tomosynthesis
was performed. The images were evaluated with computer-aided
detection.; Targeted ultrasound examination of the left breast was
performed

[Series 1: us breast*left* limited inc axilla · 0.06mm/px · 13 of 18 slices shown]
[im 1/18]
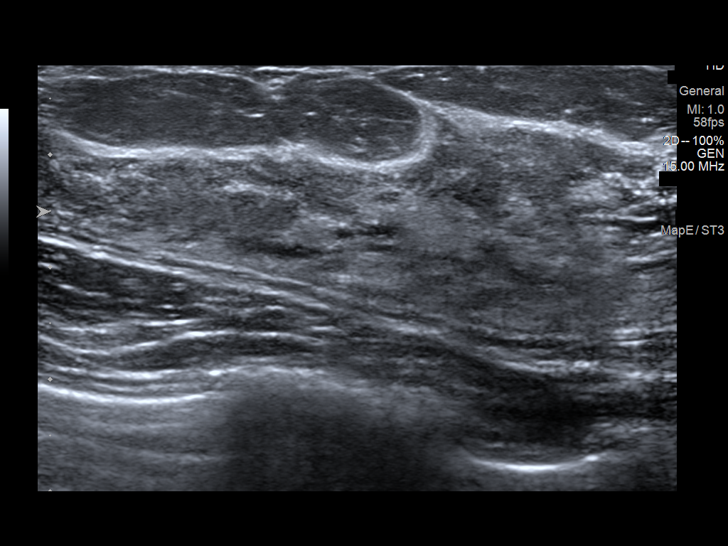
[im 3/18]
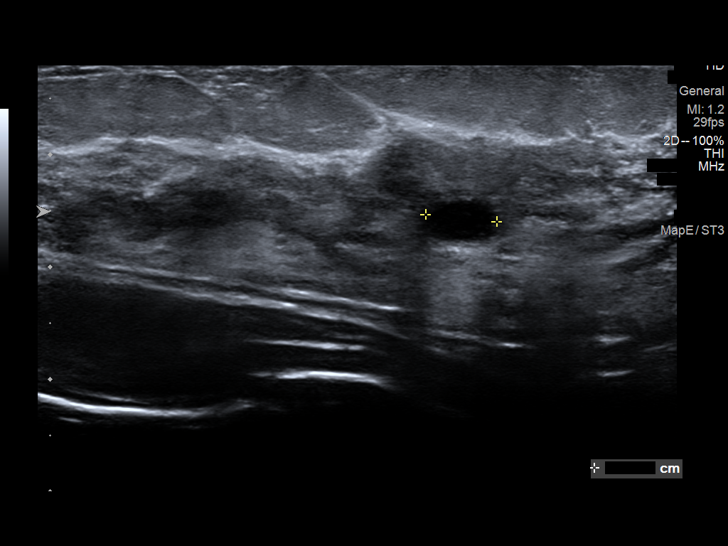
[im 4/18]
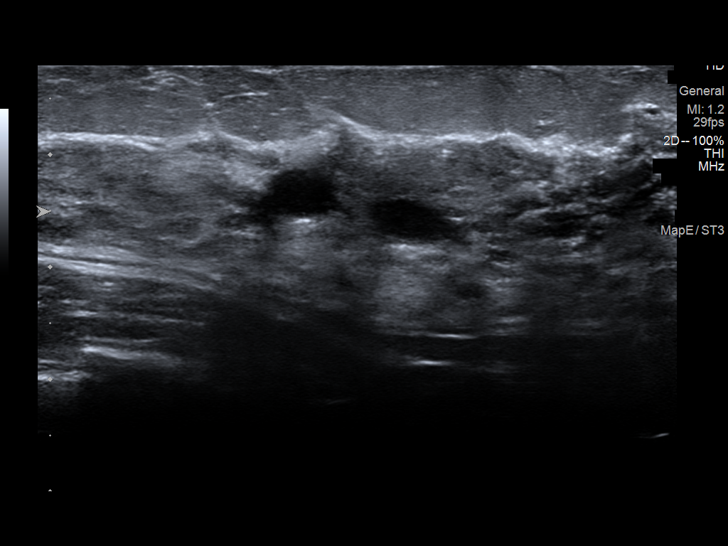
[im 5/18]
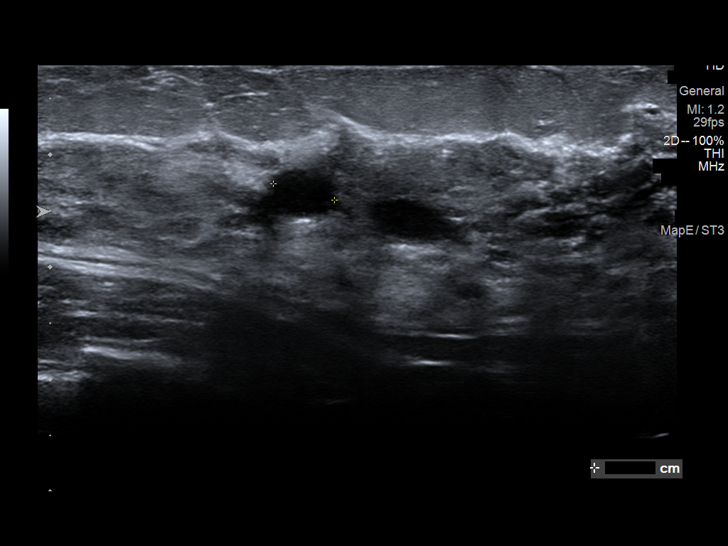
[im 7/18]
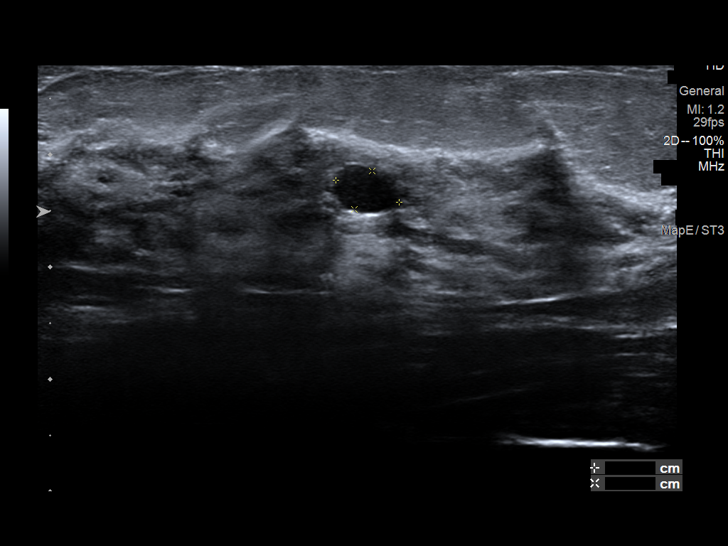
[im 8/18]
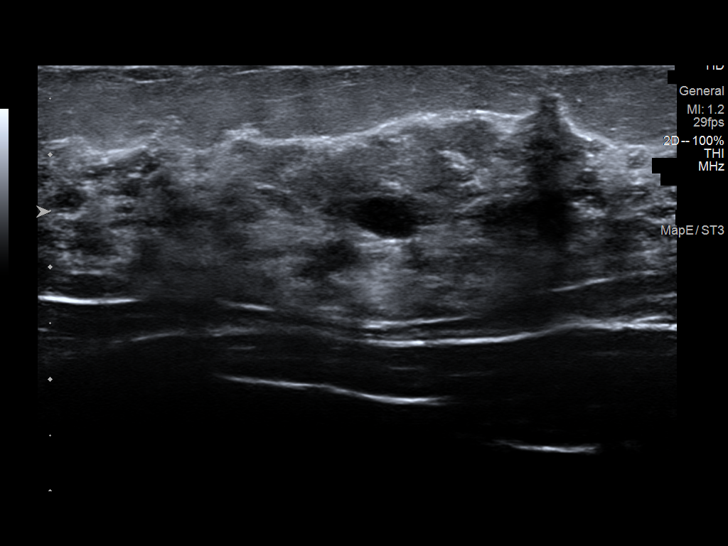
[im 10/18]
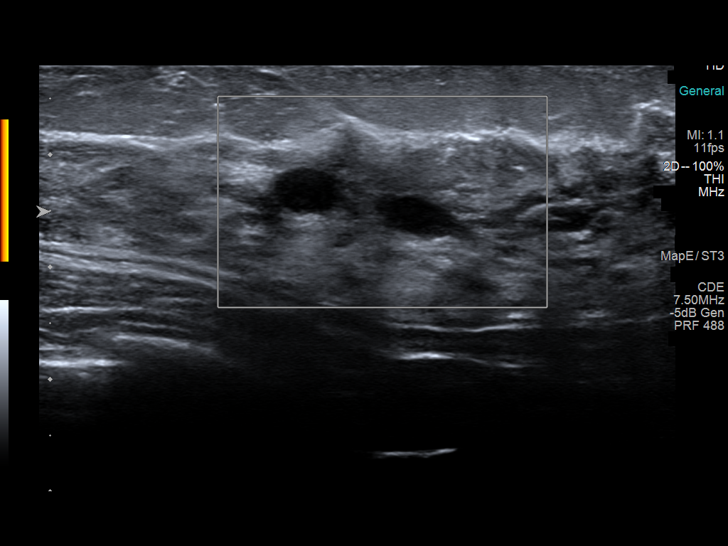
[im 11/18]
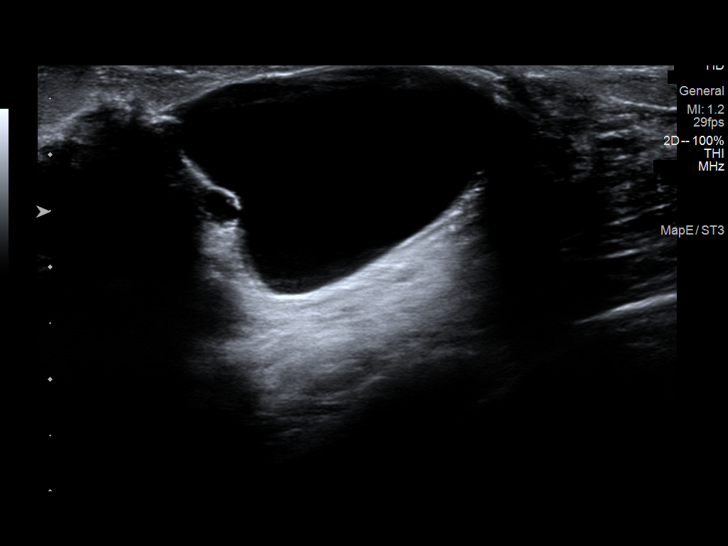
[im 12/18]
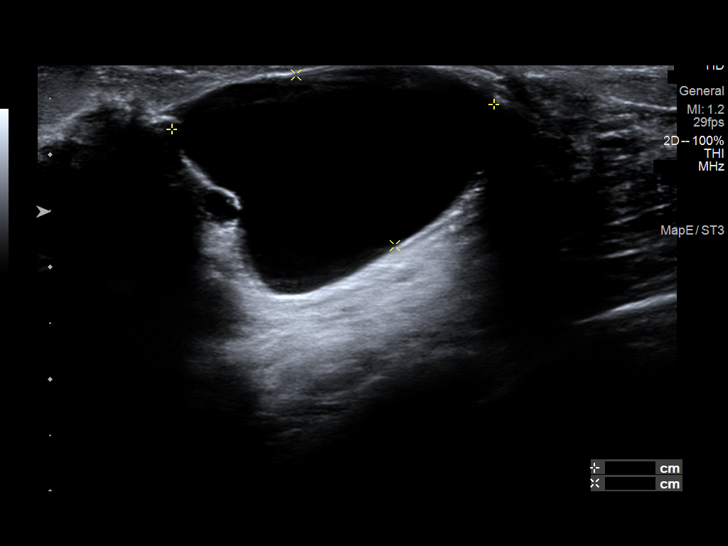
[im 14/18]
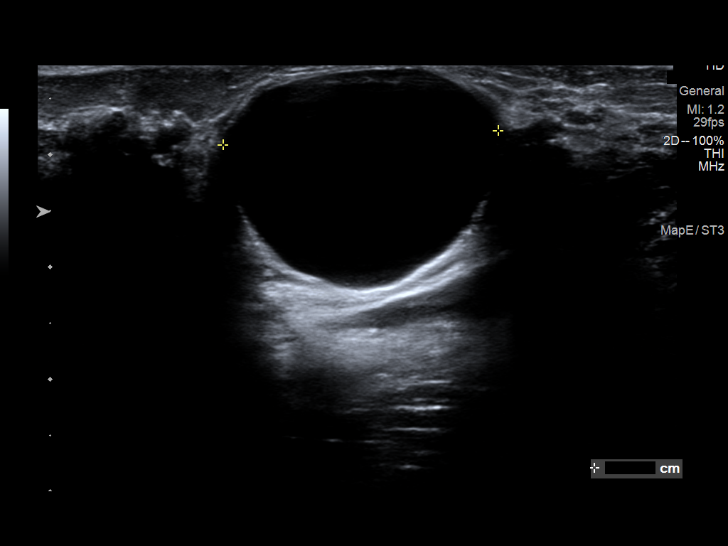
[im 15/18]
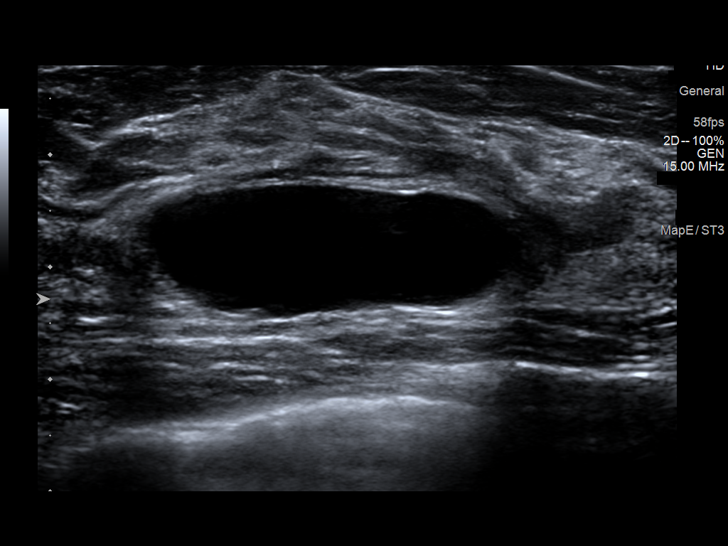
[im 16/18]
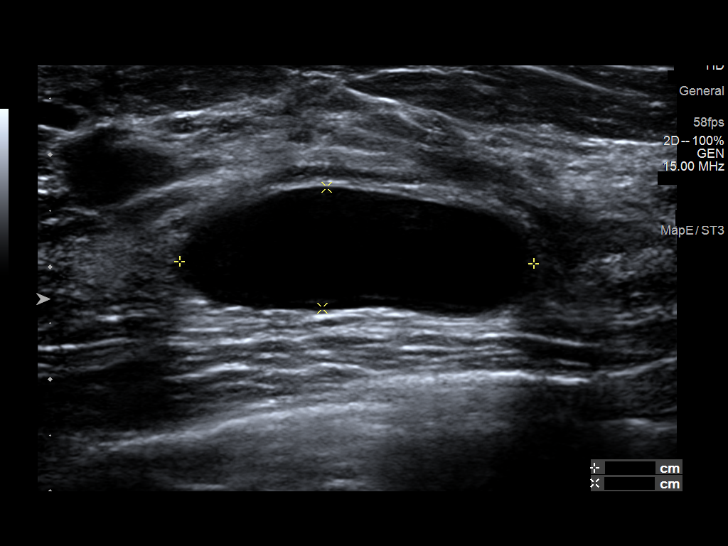
[im 18/18]
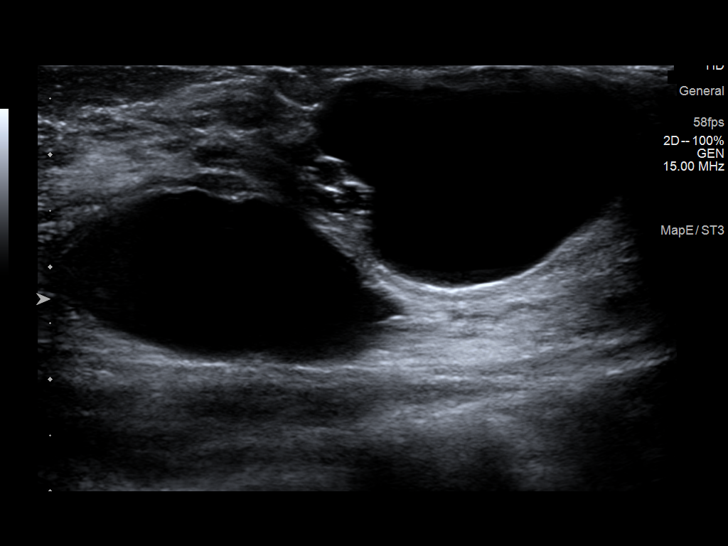

[13 of 18 positions shown; findings below may reference images not displayed]

ACR Breast Density Category d: The breast tissue is extremely dense,
which lowers the sensitivity of mammography.
FINDINGS: Radiopaque BBs were placed at the site of the patient's palpable
lumps in the superior and lateral left breast. No focal or
suspicious findings are seen deep to the superior radiopaque BB.
Oval, partially circumscribed partially obscured masses are seen
deep to the lateral radiopaque BB. Further evaluation with
ultrasound was performed.

Otherwise, no suspicious findings in the remainder of either breast.

Targeted ultrasound is performed, showing multiple oval,
circumscribed anechoic masses consistent with simple cysts scattered
throughout the breast at the site of the palpable lumps. The largest
at the 12 o'clock position 4 cm from the nipple measures up to 6 mm.
The largest at the 3 o'clock position 3 cm from the nipple measures
up to 3 cm.
IMPRESSION: 1. Benign simple cyst corresponding with the patient's left breast
palpable lumps. No further imaging follow-up required.
2. Otherwise no mammographic evidence of malignancy in either
breast.

RECOMMENDATION:
Screening mammogram in one year.(Code:0U-[id number])

I have discussed the findings and recommendations with the patient.
If applicable, a reminder letter will be sent to the patient
regarding the next appointment.

BI-RADS CATEGORY  2: Benign.

## 2021-10-26 IMAGING — MG DIGITAL DIAGNOSTIC BILAT W/ TOMO W/ CAD
8 of 15 series · 8 of 40 positions shown · non-contrast
Comparison: Previous exam(s).

CLINICAL DATA: 44-year-old female with palpable left breast lumps.

EXAM:
DIGITAL DIAGNOSTIC BILATERAL MAMMOGRAM WITH TOMOSYNTHESIS AND CAD;
ULTRASOUND LEFT BREAST LIMITED
TECHNIQUE: Bilateral digital diagnostic mammography and breast tomosynthesis
was performed. The images were evaluated with computer-aided
detection.; Targeted ultrasound examination of the left breast was
performed

[L XCCL synth-2D]
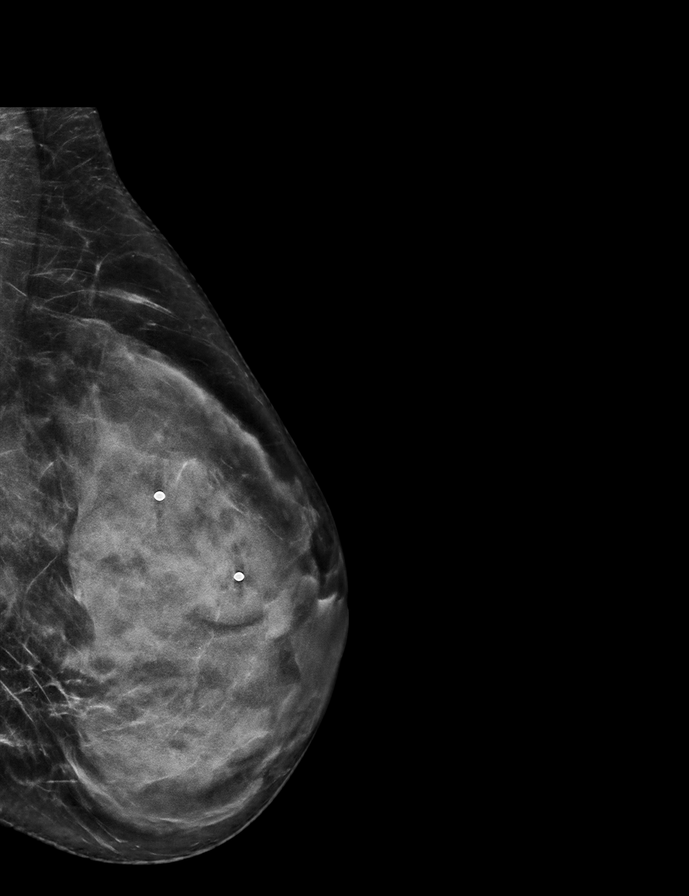

[R CC synth-2D]
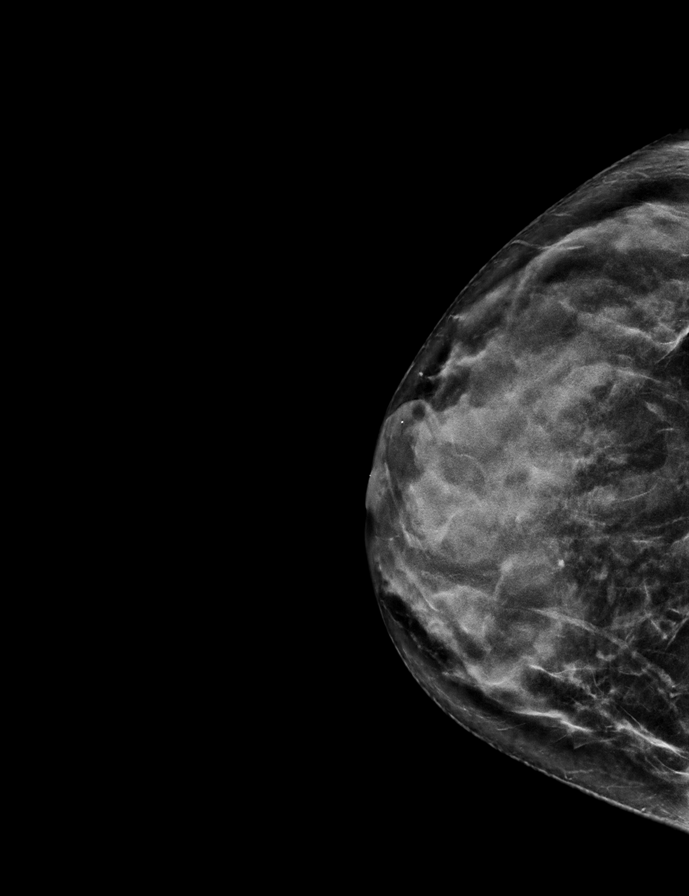

[L MLO synth-2D (1 of 3)]
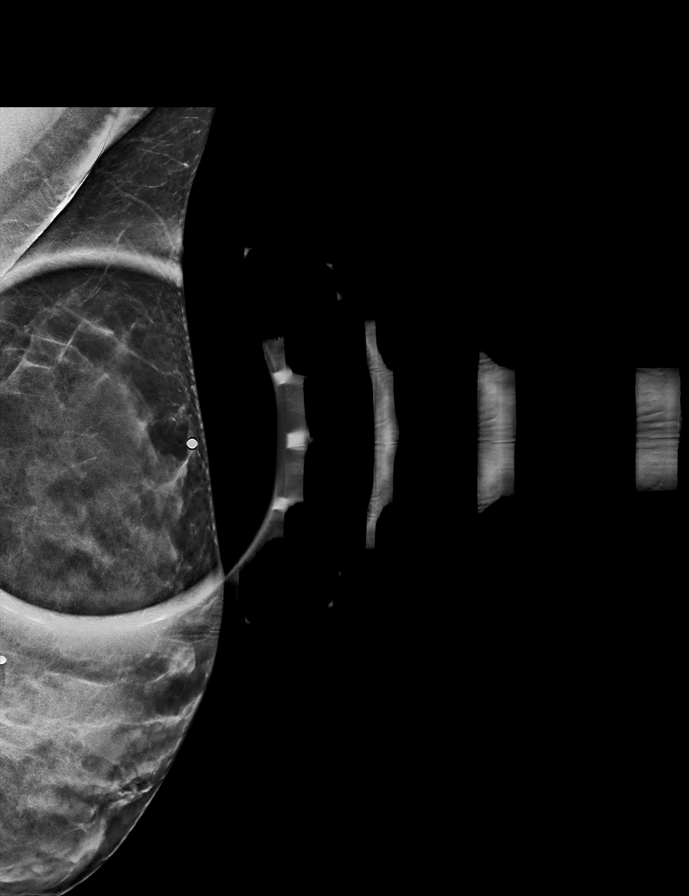

[L MLO synth-2D (2 of 3)]
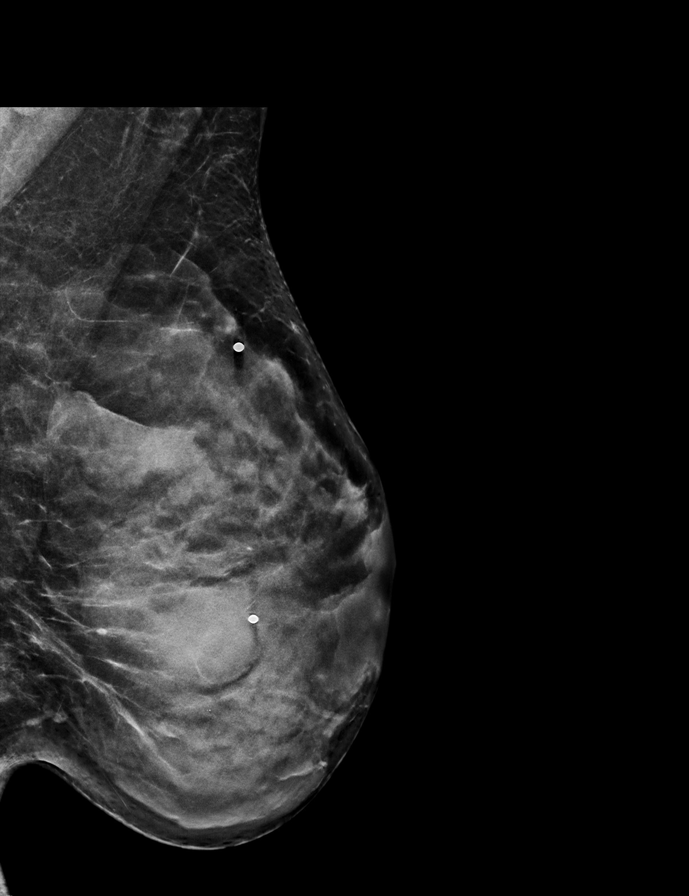

[L CC synth-2D]
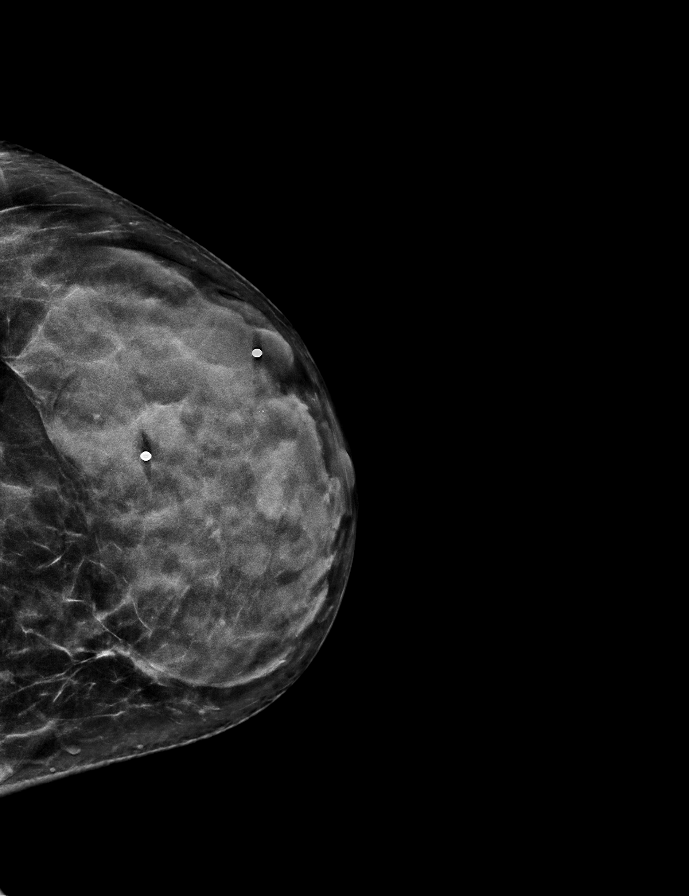

[R XCCL synth-2D]
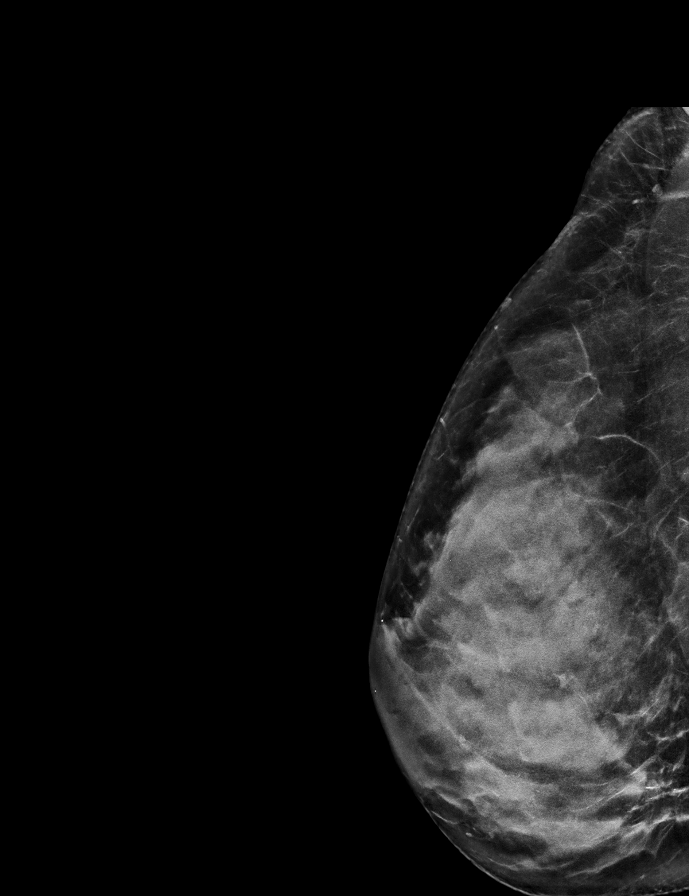

[L MLO synth-2D (3 of 3)]
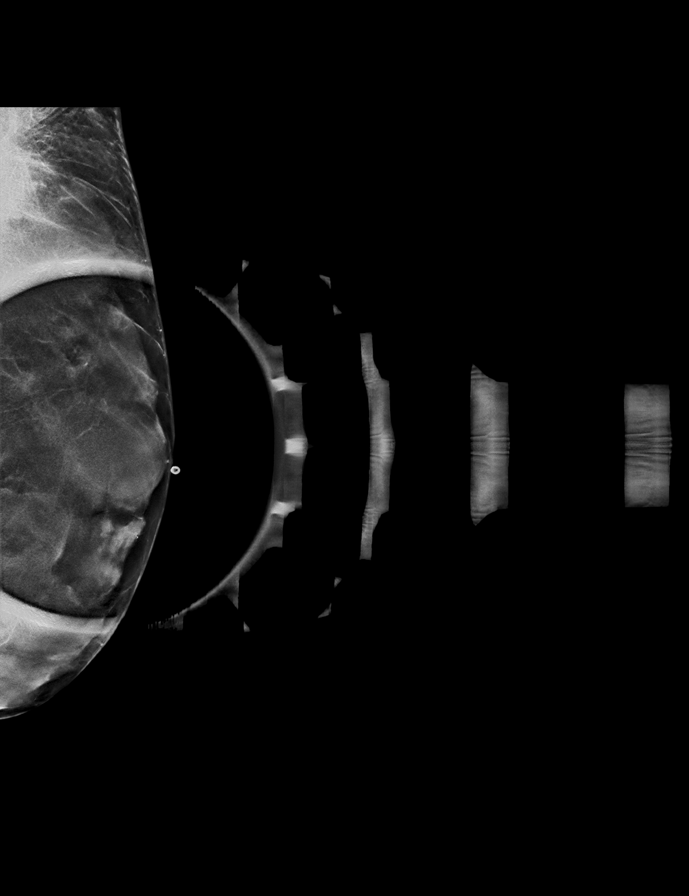

[R MLO tomo · tomo slice 40/59.0]
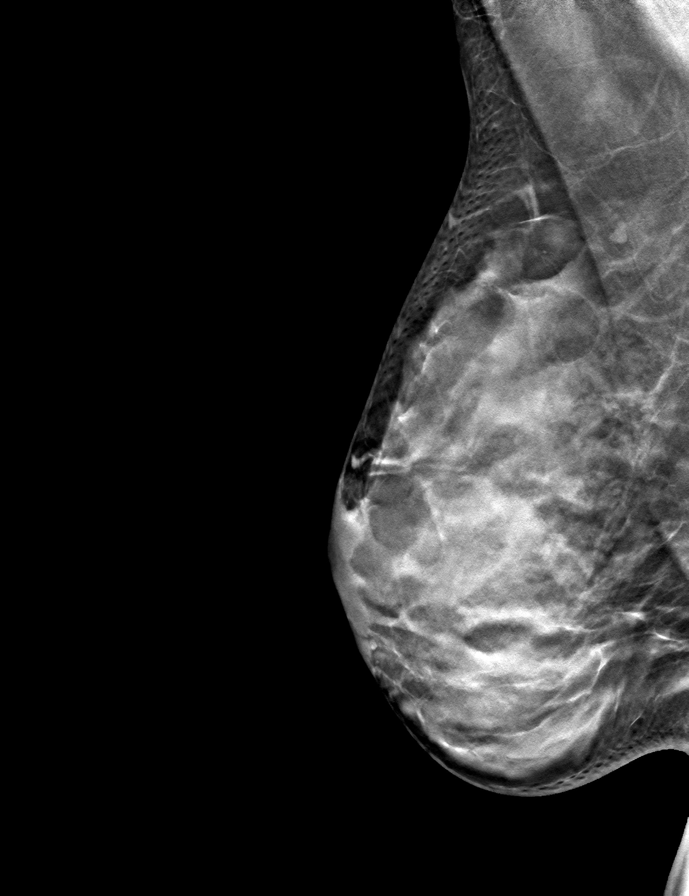

[8 of 40 positions shown; findings below may reference images not displayed]

ACR Breast Density Category d: The breast tissue is extremely dense,
which lowers the sensitivity of mammography.
FINDINGS: Radiopaque BBs were placed at the site of the patient's palpable
lumps in the superior and lateral left breast. No focal or
suspicious findings are seen deep to the superior radiopaque BB.
Oval, partially circumscribed partially obscured masses are seen
deep to the lateral radiopaque BB. Further evaluation with
ultrasound was performed.

Otherwise, no suspicious findings in the remainder of either breast.

Targeted ultrasound is performed, showing multiple oval,
circumscribed anechoic masses consistent with simple cysts scattered
throughout the breast at the site of the palpable lumps. The largest
at the 12 o'clock position 4 cm from the nipple measures up to 6 mm.
The largest at the 3 o'clock position 3 cm from the nipple measures
up to 3 cm.
IMPRESSION: 1. Benign simple cyst corresponding with the patient's left breast
palpable lumps. No further imaging follow-up required.
2. Otherwise no mammographic evidence of malignancy in either
breast.

RECOMMENDATION:
Screening mammogram in one year.(Code:[id number]-[id number])

I have discussed the findings and recommendations with the patient.
If applicable, a reminder letter will be sent to the patient
regarding the next appointment.

BI-RADS CATEGORY  2: Benign.
